# Patient Record
Sex: Male | Born: 1985 | Race: White | Hispanic: No | Marital: Single | Smoking: Current every day smoker
Health system: Southern US, Community
[De-identification: ages and names within clinical notes are randomized; demographics above are authoritative.]

---

## 2013-09-24 ENCOUNTER — Encounter: Payer: Self-pay | Admitting: Emergency Medicine

## 2013-09-24 ENCOUNTER — Emergency Department
Admission: EM | Admit: 2013-09-24 | Discharge: 2013-09-24 | Disposition: A | Discharge status: Home / Self Care | Payer: Self-pay | Source: Home / Self Care | Attending: Family Medicine | Admitting: Family Medicine

## 2013-09-24 DIAGNOSIS — J029 Acute pharyngitis, unspecified: Secondary | ICD-10-CM

## 2013-09-24 LAB — POCT RAPID STREP A (OFFICE): Rapid Strep A Screen: NEGATIVE

## 2013-09-24 MED ORDER — PENICILLIN V POTASSIUM 500 MG PO TABS: ORAL_TABLET | ORAL | Status: AC

## 2013-09-24 NOTE — Discharge Instructions (Signed)
Finish all of antibiotic. May take Ibuprofen 200mg , 4 tabs every 8 hours with food.  Try warm salt water gargles    Salt Water Gargle This solution will help make your mouth and throat feel better. HOME CARE INSTRUCTIONS   Mix 1 teaspoon of salt in 8 ounces of warm water.  Gargle with this solution as much or often as you need or as directed. Swish and gargle gently if you have any sores or wounds in your mouth.  Do not swallow this mixture. Document Released: 12/13/2003 Document Revised: 06/02/2011 Document Reviewed: 05/05/2008 [facility] Patient Information 2015 [place], [place]. This information is not intended to replace advice given to you by your health care provider. Make sure you discuss any questions you have with your health care provider.

## 2013-09-24 NOTE — ED Provider Notes (Signed)
CSN: [id number]     Arrival date & time 09/24/13  1559 History   First MD Initiated Contact with Patient 09/24/13 1645     Chief Complaint  Patient presents with  . Sore Throat  . Cough  . Fever      HPI Comments: Two days ago patient developed sore throat, fever, sinus congestion, and chills.  He has now developed a cough and left earache.   He continues to smoke.  The history is provided by the patient.    History reviewed. No pertinent past medical history. History reviewed. No pertinent past surgical history. History reviewed. No pertinent family history. History  Substance Use Topics  . Smoking status: Current Every Day Smoker -- 1.00 packs/day for 8 years    Types: Cigarettes  . Smokeless tobacco: Never Used  . Alcohol Use: Yes    Review of Systems + sore throat + cough No pleuritic pain No wheezing + nasal congestion ? post-nasal drainage No sinus pain/pressure No itchy/red eyes + left earache No hemoptysis No SOB + fever, + chills No nausea No vomiting No abdominal pain No diarrhea No urinary symptoms No skin rash + fatigue No myalgias No headache Used OTC meds without relief  Allergies  Review of patient's allergies indicates no known allergies.  Home Medications   Prior to Admission medications   Medication Sig Start Date End Date Taking? Authorizing Provider  penicillin v potassium (VEETID) 500 MG tablet Take one tab by mouth twice daily for 10 days 09/24/13   Lattie HawStephen A Leone Mobley, MD   BP 119/88  Pulse 124  Temp(Src) 100 F (37.8 C) (Oral)  Ht 5\' 10"  (1.778 m)  Wt 135 lb (61.236 kg)  BMI 19.37 kg/m2  SpO2 98% Physical Exam Nursing notes and Vital Signs reviewed. Appearance:  Patient appears healthy, stated age, and in no acute distress Eyes:  Pupils are equal, round, and reactive to light and accomodation.  Extraocular movement is intact.  Conjunctivae are not inflamed  Ears:  Canals normal.  Tympanic membranes normal.  Nose:  Mildly  congested turbinates.  No sinus tenderness.   Pharynx:  Erythematous and slightly swollen without obstruction; left tonsil larger than right Neck:  Supple.  Enlarged tender anterior nodes are palpated bilaterally  Lungs:  Clear to auscultation.  Breath sounds are equal.  Heart:  Regular rate and rhythm without murmurs, rubs, or gallops.  Abdomen:  Nontender without masses or hepatosplenomegaly.  Bowel sounds are present.  No CVA or flank tenderness.  Extremities:  No edema.  No calf tenderness Skin:  No rash present.   ED Course  Procedures  none    Labs Reviewed  POCT RAPID STREP A (OFFICE) - Normal  STREP A DNA PROBE       MDM   1. Sore throat; ?false negative rapid strep    Throat culture pending. Begin Penicillin VK Finish all of antibiotic. May take Ibuprofen 200mg , 4 tabs every 8 hours with food.  Try warm salt water gargles. Followup with Family Doctor if not improved in one week.     Lattie HawStephen A Lex Linhares, MD 09/26/13 [phone]

## 2013-09-24 NOTE — ED Notes (Signed)
George Oliver complains of sore throat, fevers and cough for 2 days.

## 2013-09-26 LAB — STREP A DNA PROBE: GASP: NEGATIVE

## 2013-09-27 ENCOUNTER — Telehealth: Payer: Self-pay

## 2013-09-27 NOTE — ED Notes (Signed)
Unable to leave message, no voice mail. Throat culture is negative.

## 2019-02-22 ENCOUNTER — Other Ambulatory Visit: Payer: Self-pay

## 2019-02-22 ENCOUNTER — Emergency Department (HOSPITAL_COMMUNITY): Payer: Self-pay

## 2019-02-22 ENCOUNTER — Emergency Department (HOSPITAL_COMMUNITY)
Admission: EM | Admit: 2019-02-22 | Discharge: 2019-02-22 | Disposition: A | Discharge status: Home / Self Care | Payer: Self-pay | Attending: Emergency Medicine | Admitting: Emergency Medicine

## 2019-02-22 DIAGNOSIS — M545 Low back pain, unspecified: Secondary | ICD-10-CM

## 2019-02-22 DIAGNOSIS — F17210 Nicotine dependence, cigarettes, uncomplicated: Secondary | ICD-10-CM | POA: Insufficient documentation

## 2019-02-22 MED ORDER — CYCLOBENZAPRINE HCL 10 MG PO TABS: 10.0000 mg | ORAL_TABLET | Freq: Two times a day (BID) | ORAL | 0 refills | Status: AC | PRN

## 2019-02-22 MED ORDER — CYCLOBENZAPRINE HCL 10 MG PO TABS
10.0000 mg | ORAL_TABLET | Freq: Once | ORAL | Status: AC
  Administered 2019-02-22: 10 mg via ORAL
  Filled 2019-02-22: qty 1

## 2019-02-22 MED ORDER — NAPROXEN 500 MG PO TABS: 500.0000 mg | ORAL_TABLET | Freq: Two times a day (BID) | ORAL | 0 refills | Status: AC

## 2019-02-22 MED ORDER — NAPROXEN 250 MG PO TABS
500.0000 mg | ORAL_TABLET | Freq: Once | ORAL | Status: AC
  Administered 2019-02-22: 500 mg via ORAL
  Filled 2019-02-22: qty 2

## 2019-02-22 NOTE — ED Triage Notes (Signed)
Pt injured his back while helping lift a couch earlier today-c/o lower back pain, especially when twisting.

## 2019-02-22 NOTE — Discharge Instructions (Addendum)
You are seen today for lower back pain.  Your x-ray did not show any new or acute findings.  You did have something called retrolisthesis in your lower vertebrae.  This is something that you may have had your whole life or it may have developed over time and is like an abnormal alignment in your back. The treatment for this is rest, ice, anti-inflammatory medications and muscle relaxants.  Using a back brace when you have to to lift anything heavy or stand for prolonged period of time is also advised.  You should follow-up with your primary care doctor in 1 to 2 weeks for further evaluation and treatment if you have persistent symptoms or if you have any new or worsening symptoms you can be reevaluated in the emergency department.

## 2019-02-22 NOTE — ED Notes (Signed)
Patient verbalizes understanding of discharge instructions. Opportunity for questioning and answers were provided. Armband removed by staff, pt discharged from ED.  

## 2019-02-22 NOTE — ED Provider Notes (Signed)
[facility] EMERGENCY DEPARTMENT Provider Note   CSN: [id number] Arrival date & time: 02/22/19  1807     History   Chief Complaint Chief Complaint  Patient presents with  . Back Pain    HPI George Oliver is a 33 y.o. male.     33 year old male with no past medical history presents to the emergency department for acute lower back pain.  Patient reports that around 230 he tried to lift a couch to help his brother when he felt immediate pain in his lower back.  He felt like the pain then shot down his leg and up into the middle part of the spine.  Has not tried anything for relief.  Reports that the pain is worse with movement and twisting.  Denies any numbness, tingling, saddle anesthesia, fever, IV drug use, weakness in his legs     No past medical history on file.  There are no active problems to display for this patient.   No past surgical history on file.      Home Medications    Prior to Admission medications   Medication Sig Start Date End Date Taking? Authorizing Provider  cyclobenzaprine (FLEXERIL) 10 MG tablet Take 1 tablet (10 mg total) by mouth 2 (two) times daily as needed for up to 7 days for muscle spasms. 02/22/19 03/01/19  Ronnie Doss A, PA-C  naproxen (NAPROSYN) 500 MG tablet Take 1 tablet (500 mg total) by mouth 2 (two) times daily. 02/22/19   Ronnie Doss A, PA-C  penicillin v potassium (VEETID) 500 MG tablet Take one tab by mouth twice daily for 10 days 09/24/13   Lattie Haw, MD    Family History No family history on file.  Social History Social History   Tobacco Use  . Smoking status: Current Every Day Smoker    Packs/day: 1.00    Years: 8.00    Pack years: 8.00    Types: Cigarettes  . Smokeless tobacco: Never Used  Substance Use Topics  . Alcohol use: Yes  . Drug use: No     Allergies   Patient has no known allergies.   Review of Systems Review of Systems  Constitutional: Negative for chills, fatigue and  fever.  Genitourinary: Negative for decreased urine volume and difficulty urinating.  Musculoskeletal: Positive for back pain. Negative for arthralgias, gait problem, joint swelling, myalgias, neck pain and neck stiffness.  Skin: Negative for rash and wound.  Neurological: Negative for weakness and numbness.     Physical Exam Updated Vital Signs BP 130/69 (BP Location: Right Arm)   Pulse 94   Temp 98.6 F (37 C) (Oral)   Resp 16   SpO2 97%   Physical Exam Vitals signs and nursing note reviewed.  Constitutional:      General: He is not in acute distress.    Appearance: Normal appearance. He is not ill-appearing, toxic-appearing or diaphoretic.  HENT:     Head: Normocephalic.  Eyes:     Conjunctiva/sclera: Conjunctivae normal.  Pulmonary:     Effort: Pulmonary effort is normal.  Musculoskeletal:     Comments: Diffuse bilateral musculoskeletal tenderness in the bilateral lower lumbar muscles.  He has normal strength, sensation, reflexes, pulses in the bilateral lower extremities.  Skin:    General: Skin is warm and dry.  Neurological:     General: No focal deficit present.     Mental Status: He is alert.     Sensory: No sensory deficit.  Motor: No weakness.     Gait: Gait normal.     Deep Tendon Reflexes: Reflexes normal.  Psychiatric:        Mood and Affect: Mood normal.      ED Treatments / Results  Labs (all labs ordered are listed, but only abnormal results are displayed) Labs Reviewed - No data to display  EKG None  Radiology Dg Lumbar Spine Complete  Result Date: 02/22/2019 CLINICAL DATA:  33 year old male with back pain. EXAM: LUMBAR SPINE - COMPLETE 4+ VIEW COMPARISON:  None. FINDINGS: Five lumbar type vertebra. There is no acute fracture or subluxation of the lumbar spine. The vertebral body heights and disc spaces are maintained. The visualized posterior elements appear intact. There is grade 1 L5-S1 retrolisthesis. The soft tissues are unremarkable.  IMPRESSION: 1. No acute/traumatic lumbar spine pathology. 2. Grade 1 L5-S1 retrolisthesis. Electronically Signed   By: Anner Crete M.D.   On: 02/22/2019 19:10    Procedures Procedures (including critical care time)  Medications Ordered in ED Medications  cyclobenzaprine (FLEXERIL) tablet 10 mg (has no administration in time range)  naproxen (NAPROSYN) tablet 500 mg (has no administration in time range)     Initial Impression / Assessment and Plan / ED Course  I have reviewed the triage vital signs and the nursing notes.  Pertinent labs & imaging results that were available during my care of the patient were reviewed by me and considered in my medical decision making (see chart for details).  Clinical Course as of Feb 22 1944  Tue Feb 22, 2019  1942 Patient was evaluated for back pain today. Patient has no concerning symptoms or physical exam findings including no fever, no loss of control of bowel or bladder, no urinary retention, no saddle anesthesia, no leg weakness and no pain radiation into the legs. She was given medication to treat her symptoms and advised to f/u with PMD for further workup including possible PT, medication change, further imaging, etc. She was advised on all concerning symptoms above and to return to the ED if any of them arise.       [KM]    Clinical Course User Index [KM] Alveria Apley, PA-C       Based on review of vitals, medical screening exam, lab work and/or imaging, there does not appear to be an acute, emergent etiology for the patient's symptoms. Counseled pt on good return precautions and encouraged both PCP and ED follow-up as needed.  Prior to discharge, I also discussed incidental imaging findings with patient in detail and advised appropriate, recommended follow-up in detail.  Clinical Impression: 1. Acute bilateral low back pain without sciatica     Disposition: Discharge  Prior to providing a prescription for a controlled  substance, I independently reviewed the patient's recent prescription history on the [place]. The patient had no recent or regular prescriptions and was deemed appropriate for a brief, less than 3 day prescription of narcotic for acute analgesia.  This note was prepared with assistance of Systems analyst. Occasional wrong-word or sound-a-like substitutions may have occurred due to the inherent limitations of voice recognition software.   Final Clinical Impressions(s) / ED Diagnoses   Final diagnoses:  Acute bilateral low back pain without sciatica    ED Discharge Orders         Ordered    naproxen (NAPROSYN) 500 MG tablet  2 times daily     02/22/19 1945    cyclobenzaprine (  FLEXERIL) 10 MG tablet  2 times daily PRN     02/22/19 1945           Jeral PinchMcLean, Narciso Stoutenburg A, PA-C 02/22/19 1945    Little, Ambrose Finlandachel Morgan, MD 02/22/19 2126

## 2019-02-22 NOTE — ED Notes (Signed)
Patient transported to X-ray 

## 2021-05-16 IMAGING — CR DG LUMBAR SPINE COMPLETE 4+V
4 series · 4 of 4 positions shown · non-contrast
Comparison: None.

CLINICAL DATA: 33-year-old male with back pain.

EXAM:
LUMBAR SPINE - COMPLETE 4+ VIEW

[l-spine obl (1 of 2)]
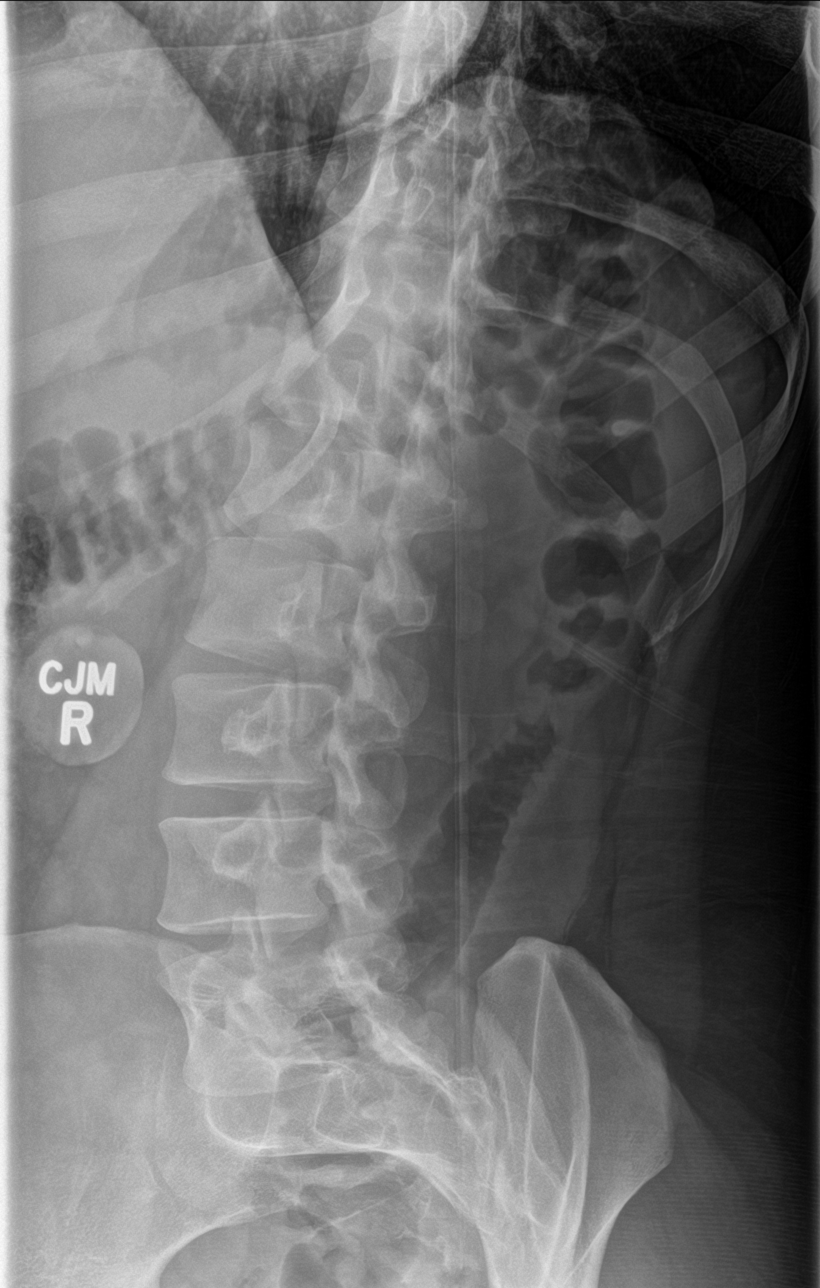

[l-spine obl (2 of 2)]
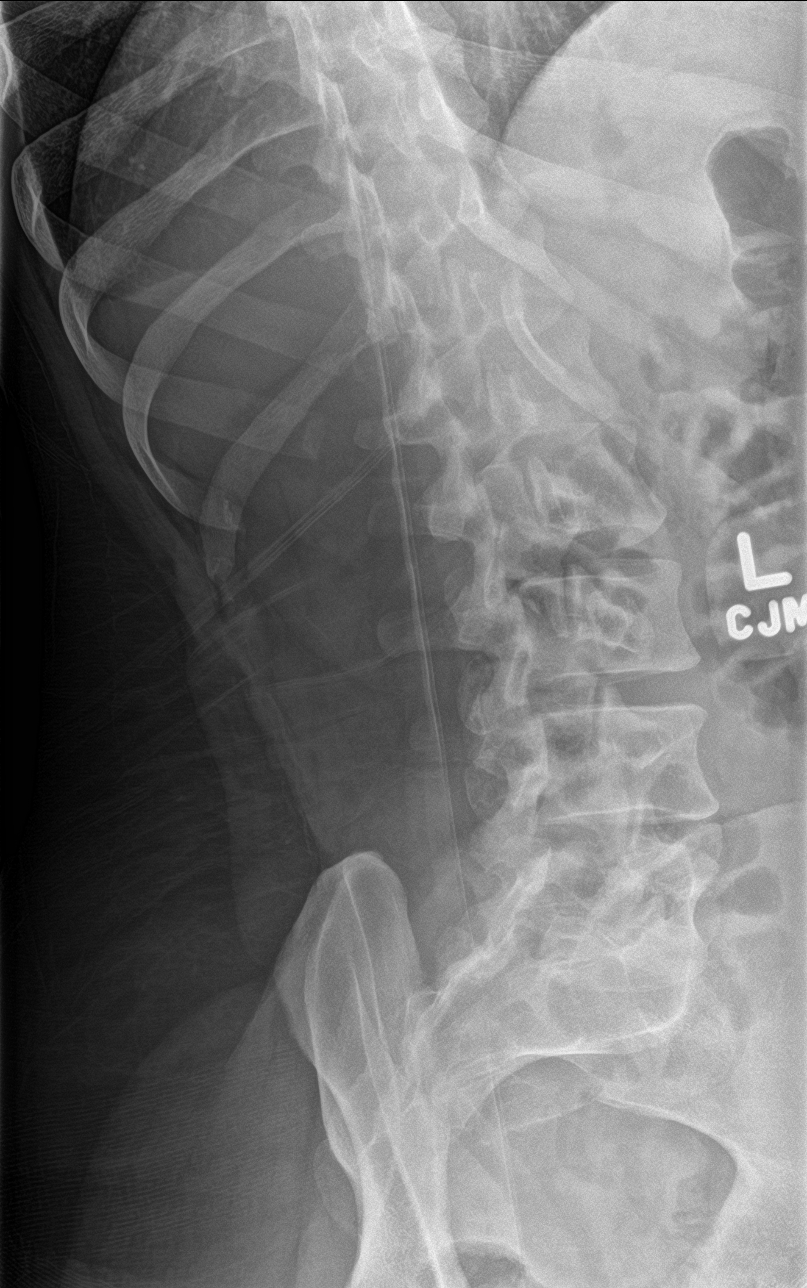

[l-spine lat]
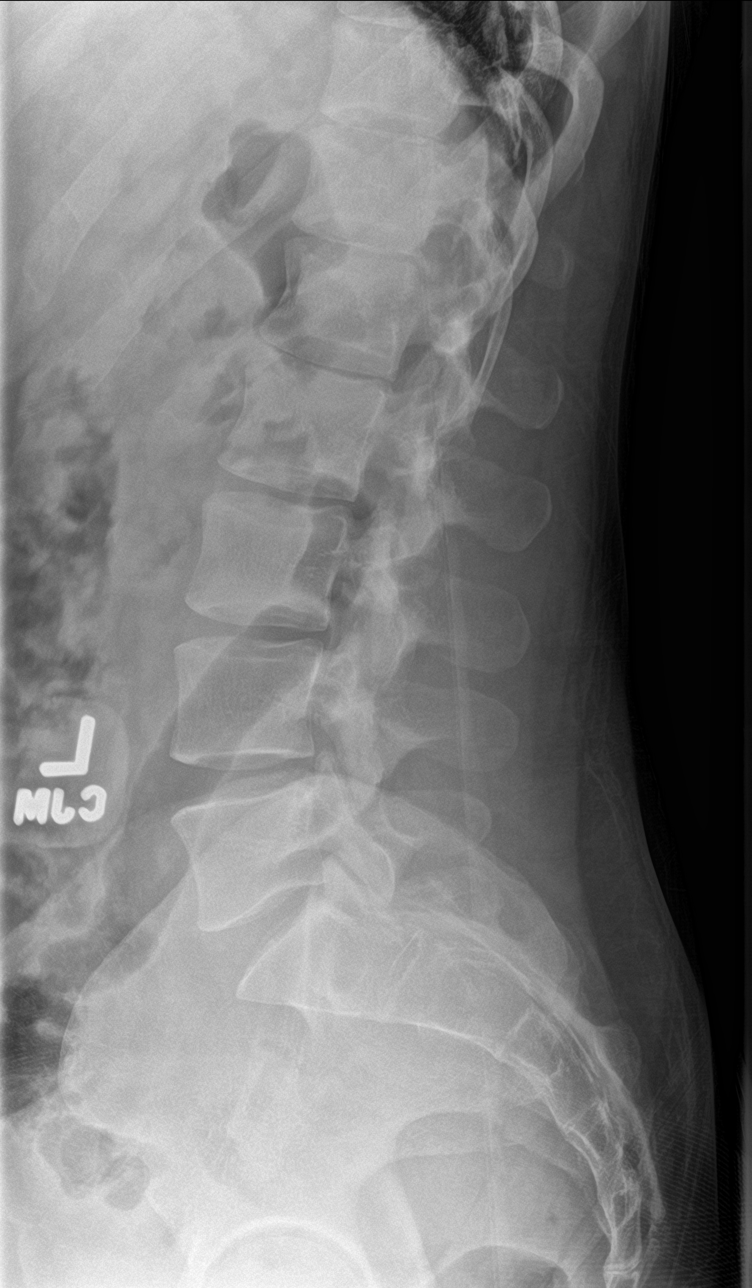

[l-spine spot]
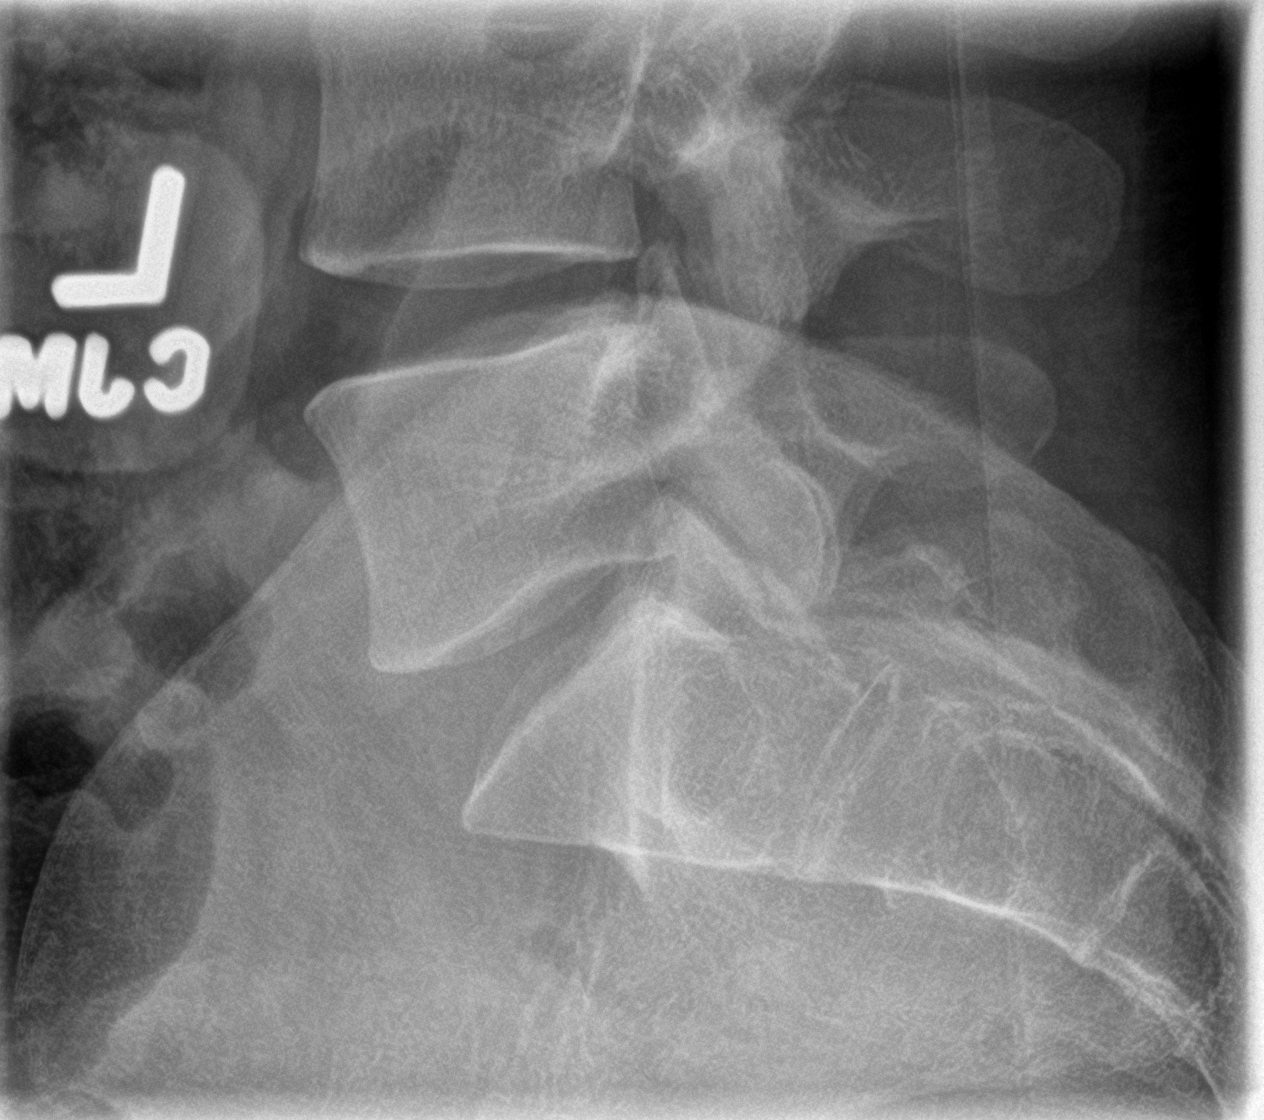

[4 of 4 positions shown; findings below may reference images not displayed]

FINDINGS: Five lumbar type vertebra. There is no acute fracture or subluxation
of the lumbar spine. The vertebral body heights and disc spaces are
maintained. The visualized posterior elements appear intact. There
is grade 1 L5-S1 retrolisthesis. The soft tissues are unremarkable.
IMPRESSION: 1. No acute/traumatic lumbar spine pathology.
2. Grade 1 L5-S1 retrolisthesis.
